# Patient Record
Sex: Male | Born: 1980 | Race: White | Hispanic: No | Marital: Married | Smoking: Current every day smoker
Health system: Southern US, Community
[De-identification: ages and names within clinical notes are randomized; demographics above are authoritative.]

---

## 2011-07-24 ENCOUNTER — Emergency Department (INDEPENDENT_AMBULATORY_CARE_PROVIDER_SITE_OTHER)
Admission: EM | Admit: 2011-07-24 | Discharge: 2011-07-24 | Disposition: A | Discharge status: Home / Self Care | Payer: Managed Care, Other (non HMO) | Source: Home / Self Care | Attending: Emergency Medicine | Admitting: Emergency Medicine

## 2011-07-24 DIAGNOSIS — S43429A Sprain of unspecified rotator cuff capsule, initial encounter: Secondary | ICD-10-CM

## 2011-07-24 DIAGNOSIS — S46019A Strain of muscle(s) and tendon(s) of the rotator cuff of unspecified shoulder, initial encounter: Secondary | ICD-10-CM

## 2011-07-24 DIAGNOSIS — M25519 Pain in unspecified shoulder: Secondary | ICD-10-CM

## 2011-07-24 MED ORDER — MELOXICAM 7.5 MG PO TABS: 7.5000 mg | ORAL_TABLET | Freq: Two times a day (BID) | ORAL | Status: AC | PRN
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
## 2011-07-24 NOTE — ED Notes (Signed)
States he fell playing softball on Thursday now having left shoulder pain

## 2011-08-02 ENCOUNTER — Encounter: Payer: Self-pay | Admitting: Family Medicine

## 2012-09-24 ENCOUNTER — Emergency Department
Admission: EM | Admit: 2012-09-24 | Discharge: 2012-09-24 | Disposition: A | Discharge status: Home / Self Care | Payer: BC Managed Care – PPO | Source: Home / Self Care | Attending: Family Medicine | Admitting: Family Medicine

## 2012-09-24 DIAGNOSIS — J209 Acute bronchitis, unspecified: Secondary | ICD-10-CM

## 2012-09-24 DIAGNOSIS — J029 Acute pharyngitis, unspecified: Secondary | ICD-10-CM

## 2012-09-24 MED ORDER — CLARITHROMYCIN 250 MG PO TABS: ORAL_TABLET | ORAL | Status: DC

## 2012-09-24 MED ORDER — BENZONATATE 200 MG PO CAPS: 200.0000 mg | ORAL_CAPSULE | Freq: Every day | ORAL | Status: DC

## 2012-09-24 NOTE — ED Provider Notes (Signed)
History     CSN: [id number]  Arrival date & time 09/24/12  1147   First MD Initiated Contact with Patient 09/24/12 1204      Chief Complaint  Patient presents with  . Nasal Congestion    off and on x 1 month  . Cough    x 1 month  . Sore Throat    x 1 month     HPI Comments: Elijah Day state he has been sick off and on for over a month. He complains of nasal congestion with gray discharge, productive cough with gray sputum and sore throat. He states he also has had fever, chills and sweats off and on over the last week.  He has also had myalgias and increased fatigue over the past 5 days.  He occasionally coughs until he gags.  He continues to smoke.  The history is provided by the patient.    History reviewed. No pertinent past medical history.  History reviewed. No pertinent past surgical history.  Family History  Problem Relation Age of Onset  . Cancer Other   . Stroke Other     History  Substance Use Topics  . Smoking status: Current Every Day Smoker -- 1.5 packs/day for 15 years    Types: Cigarettes  . Smokeless tobacco: Not on file  . Alcohol Use: Yes      Review of Systems + sore throat + cough No pleuritic pain No wheezing + nasal congestion + post-nasal drainage No sinus pain/pressure No itchy/red eyes ? earache No hemoptysis No SOB + fever, + chills No nausea No vomiting No abdominal pain No diarrhea No urinary symptoms No skin rashes + fatigue + myalgias + headache Used OTC meds without relief  Allergies  Review of patient's allergies indicates no known allergies.  Home Medications   Current Outpatient Rx  Name  Route  Sig  Dispense  Refill  . BENZONATATE 200 MG PO CAPS   Oral   Take 1 capsule (200 mg total) by mouth at bedtime. Take as needed for cough   12 capsule   0   . CLARITHROMYCIN 250 MG PO TABS      Take one tab by mouth every 12 hours   14 tablet   0     BP 114/76  Pulse 81  Temp 98.2 F (36.8 C) (Oral)  Resp  16  Ht 5\' 8"  (1.727 m)  Wt 136 lb (61.689 kg)  BMI 20.68 kg/m2  SpO2 96%  Physical Exam Nursing notes and Vital Signs reviewed. Appearance:  Patient appears healthy, stated age, and in no acute distress Eyes:  Pupils are equal, round, and reactive to light and accomodation.  Extraocular movement is intact.  Conjunctivae are not inflamed  Ears:  Canals normal.  Tympanic membranes normal.  Nose:  Mildly congested turbinates.  No sinus tenderness.  Pharynx:  Normal Neck:  Supple.  Non-tender shotty posterior nodes are palpated bilaterally  Lungs:  Clear to auscultation.  Breath sounds are equal.  Heart:  Regular rate and rhythm without murmurs, rubs, or gallops.  Abdomen:  Nontender without masses or hepatosplenomegaly.  Bowel sounds are present.  No CVA or flank tenderness.  Extremities:  No edema.  No calf tenderness Skin:  No rash present.   ED Course  Procedures none   Labs Reviewed  POCT RAPID STREP A (OFFICE) - Normal      1. Sore throat   2. Acute bronchitis       MDM  Begin  Biaxin.  Prescription written for Benzonatate [facility]) to take at bedtime for night-time cough.  Take Mucinex D (guaifenesin with decongestant) twice daily for congestion.  Increase fluid intake, rest. May use Afrin nasal spray (or generic oxymetazoline) twice daily for about 5 days.  Also recommend using saline nasal spray several times daily and saline nasal irrigation (AYR is a common brand) Stop all antihistamines for now, and other non-prescription cough/cold preparations. May take Ibuprofen 200mg , 4 tabs every 8 hours with food for headache, fever, etc. Follow-up with family doctor if not improving 7 to 10 days.        Lattie Haw, MD 09/28/12 [phone]

## 2012-09-24 NOTE — ED Notes (Signed)
Elijah Day state he has been sick off and on for over a month. He complains of nasal congestion with gray discharge, productive cough with gray sputum and sore throat. He states he also has had fever, chills and sweats off and on over the last week.

## 2016-03-09 ENCOUNTER — Emergency Department (INDEPENDENT_AMBULATORY_CARE_PROVIDER_SITE_OTHER): Payer: Self-pay

## 2016-03-09 ENCOUNTER — Emergency Department
Admission: EM | Admit: 2016-03-09 | Discharge: 2016-03-09 | Disposition: A | Discharge status: Home / Self Care | Payer: BLUE CROSS/BLUE SHIELD | Source: Home / Self Care | Attending: Family Medicine | Admitting: Family Medicine

## 2016-03-09 ENCOUNTER — Encounter: Payer: Self-pay | Admitting: *Deleted

## 2016-03-09 DIAGNOSIS — S0101XA Laceration without foreign body of scalp, initial encounter: Secondary | ICD-10-CM

## 2016-03-09 DIAGNOSIS — S0990XA Unspecified injury of head, initial encounter: Secondary | ICD-10-CM

## 2016-03-09 DIAGNOSIS — M25512 Pain in left shoulder: Secondary | ICD-10-CM

## 2016-03-09 DIAGNOSIS — S40012A Contusion of left shoulder, initial encounter: Secondary | ICD-10-CM | POA: Diagnosis not present

## 2016-03-09 DIAGNOSIS — Z23 Encounter for immunization: Secondary | ICD-10-CM

## 2016-03-09 MED ORDER — CEPHALEXIN 500 MG PO CAPS: 500.0000 mg | ORAL_CAPSULE | Freq: Two times a day (BID) | ORAL | Status: AC

## 2016-03-09 MED ORDER — TETANUS-DIPHTH-ACELL PERTUSSIS 5-2.5-18.5 LF-MCG/0.5 IM SUSP
0.5000 mL | Freq: Once | INTRAMUSCULAR | Status: AC
  Administered 2016-03-09: 0.5 mL via INTRAMUSCULAR

## 2016-03-09 NOTE — Discharge Instructions (Signed)
Apply Bacitracin ointment to wound on scalp daily.  Keep wound clean and dry.  Return for any signs of infection (or follow-up with family doctor):  Increasing redness, swelling, pain, heat, drainage, etc. Return in 10 days for staple removal.  May take Tylenol if needed for headache, pain. Apply ice pack for 15 to 20 minutes, 3 to 4 times daily  Continue until pain and swelling decrease.    Head Injury, Adult You have a head injury. Headaches and throwing up (vomiting) are common after a head injury. It should be easy to wake up from sleeping. Sometimes you must stay in the hospital. Most problems happen within the first 24 hours. Side effects may occur up to 7-10 days after the injury.  WHAT ARE THE TYPES OF HEAD INJURIES? Head injuries can be as minor as a bump. Some head injuries can be more severe. More severe head injuries include:  A jarring injury to the brain (concussion).  A bruise of the brain (contusion). This mean there is bleeding in the brain that can cause swelling.  A cracked skull (skull fracture).  Bleeding in the brain that collects, clots, and forms a bump (hematoma). WHEN SHOULD I GET HELP RIGHT AWAY?   You are confused or sleepy.  You cannot be woken up.  You feel sick to your stomach (nauseous) or keep throwing up (vomiting).  Your dizziness or unsteadiness is getting worse.  You have very bad, lasting headaches that are not helped by medicine. Take medicines only as told by your doctor.  You cannot use your arms or legs like normal.  You cannot walk.  You notice changes in the black spots in the center of the colored part of your eye (pupil).  You have clear or bloody fluid coming from your nose or ears.  You have trouble seeing. During the next 24 hours after the injury, you must stay with someone who can watch you. This person should get help right away (call 911 in the [place] if you start to shake and are not able to control it (have seizures), you  pass out, or you are unable to wake up. HOW CAN I PREVENT A HEAD INJURY IN THE FUTURE?  Wear seat belts.  Wear a helmet while bike riding and playing sports like football.  Stay away from dangerous activities around the house. WHEN CAN I RETURN TO NORMAL ACTIVITIES AND ATHLETICS? See your doctor before doing these activities. You should not do normal activities or play contact sports until 1 week after the following symptoms have stopped:  Headache that does not go away.  Dizziness.  Poor attention.  Confusion.  Memory problems.  Sickness to your stomach or throwing up.  Tiredness.  Fussiness.  Bothered by bright lights or loud noises.  Anxiousness or depression.  Restless sleep. MAKE SURE YOU:   Understand these instructions.  Will watch your condition.  Will get help right away if you are not doing well or get worse.   This information is not intended to replace advice given to you by your health care provider. Make sure you discuss any questions you have with your health care provider.   Document Released: 08/05/2008 Document Revised: 09/13/2014 Document Reviewed: 04/30/2013 Elsevier Interactive Patient Education 2016 Elsevier Inc.   Laceration Care, Adult A laceration is a cut that goes through all of the layers of the skin and into the tissue that is right under the skin. Some lacerations heal on their own. Others need to be  closed with stitches (sutures), staples, skin adhesive strips, or skin glue. Proper laceration care minimizes the risk of infection and helps the laceration to heal better. HOW TO CARE FOR YOUR LACERATION If sutures or staples were used:  Keep the wound clean and dry.  If you were given a bandage (dressing), you should change it at least one time per day or as told by your health care provider. You should also change it if it becomes wet or dirty.  Keep the wound completely dry for the first 24 hours or as told by your health care  provider. After that time, you may shower or bathe. However, make sure that the wound is not soaked in water until after the sutures or staples have been removed.  Clean the wound one time each day or as told by your health care provider:  Wash the wound with soap and water.  Rinse the wound with water to remove all soap.  Pat the wound dry with a clean towel. Do not rub the wound.  After cleaning the wound, apply a thin layer of antibiotic ointmentas told by your health care provider. This will help to prevent infection and keep the dressing from sticking to the wound.  Have the sutures or staples removed as told by your health care provider.  General Instructions  Take over-the-counter and prescription medicines only as told by your health care provider.  If you were prescribed an antibiotic medicine or ointment, take or apply it as told by your doctor. Do not stop using it even if your condition improves.  To help prevent scarring, make sure to cover your wound with sunscreen whenever you are outside after stitches are removed, after adhesive strips are removed, or when glue remains in place and the wound is healed. Make sure to wear a sunscreen of at least 30 SPF.  Do not scratch or pick at the wound.  Keep all follow-up visits as told by your health care provider. This is important.  Check your wound every day for signs of infection. Watch for:  Redness, swelling, or pain.  Fluid, blood, or pus. SEEK MEDICAL CARE IF:  You received a tetanus shot and you have swelling, severe pain, redness, or bleeding at the injection site.  You have a fever.  A wound that was closed breaks open.  You notice a bad smell coming from your wound or your dressing.  You notice something coming out of the wound, such as wood or glass.  Your pain is not controlled with medicine.  You have increased redness, swelling, or pain at the site of your wound.  You have fluid, blood, or pus  coming from your wound.  You notice a change in the color of your skin near your wound.  You need to change the dressing frequently due to fluid, blood, or pus draining from the wound.  You develop a new rash.  You develop numbness around the wound. SEEK IMMEDIATE MEDICAL CARE IF:  You develop severe swelling around the wound.  Your pain suddenly increases and is severe.  You develop painful lumps near the wound or on skin that is anywhere on your body.  You have a red streak going away from your wound.  The wound is on your hand or foot and you cannot properly move a finger or toe.  The wound is on your hand or foot and you notice that your fingers or toes look pale or bluish.   This information is  not intended to replace advice given to you by your health care provider. Make sure you discuss any questions you have with your health care provider.   Document Released: 08/23/2005 Document Revised: 01/07/2015 Document Reviewed: 08/19/2014 Elsevier Interactive Patient Education [employer]2016 Elsevier Inc.
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
## 2016-03-25 ENCOUNTER — Emergency Department (INDEPENDENT_AMBULATORY_CARE_PROVIDER_SITE_OTHER)
Admission: EM | Admit: 2016-03-25 | Discharge: 2016-03-25 | Disposition: A | Discharge status: Home / Self Care | Payer: BLUE CROSS/BLUE SHIELD | Source: Home / Self Care | Attending: Family Medicine | Admitting: Family Medicine

## 2016-03-25 ENCOUNTER — Encounter: Payer: Self-pay | Admitting: Emergency Medicine

## 2016-03-25 DIAGNOSIS — Z4802 Encounter for removal of sutures: Secondary | ICD-10-CM | POA: Diagnosis not present

## 2016-03-25 NOTE — ED Notes (Signed)
Pt here for staple removal from head laceration.
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
## 2016-03-25 NOTE — Discharge Instructions (Signed)
Your wound appears to have healed well without any signs of infection.  There are still a few tiny holes from where staples were.  Continue to keep area with with soap and water.
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
